# Patient Record
Sex: Male | Born: 1990 | Race: White | Hispanic: No | Marital: Single | Smoking: Current every day smoker
Health system: Southern US, Community
[De-identification: ages and names within clinical notes are randomized; demographics above are authoritative.]

## PROBLEM LIST (undated history)

## (undated) DIAGNOSIS — F1910 Other psychoactive substance abuse, uncomplicated: Secondary | ICD-10-CM

---
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
## 2013-02-03 ENCOUNTER — Encounter (HOSPITAL_COMMUNITY): Payer: Self-pay | Admitting: *Deleted

## 2013-02-03 ENCOUNTER — Emergency Department (HOSPITAL_COMMUNITY)
Admission: EM | Admit: 2013-02-03 | Discharge: 2013-02-04 | Disposition: A | Discharge status: Home / Self Care | Payer: Self-pay | Attending: Emergency Medicine | Admitting: Emergency Medicine

## 2013-02-03 DIAGNOSIS — R45851 Suicidal ideations: Secondary | ICD-10-CM

## 2013-02-03 DIAGNOSIS — F17200 Nicotine dependence, unspecified, uncomplicated: Secondary | ICD-10-CM | POA: Insufficient documentation

## 2013-02-03 DIAGNOSIS — F1910 Other psychoactive substance abuse, uncomplicated: Secondary | ICD-10-CM | POA: Diagnosis present

## 2013-02-03 HISTORY — DX: Other psychoactive substance abuse, uncomplicated: F19.10

## 2013-02-03 LAB — COMPREHENSIVE METABOLIC PANEL
ALT: 22 U/L (ref 0–53)
AST: 20 U/L (ref 0–37)
Albumin: 4.4 g/dL (ref 3.5–5.2)
CO2: 26 mEq/L (ref 19–32)
Calcium: 10.2 mg/dL (ref 8.4–10.5)
Chloride: 102 mEq/L (ref 96–112)
Creatinine, Ser: 1.12 mg/dL (ref 0.50–1.35)
GFR calc non Af Amer: >90 mL/min (ref >90)
Sodium: 139 mEq/L (ref 135–145)

## 2013-02-03 LAB — CBC
MCH: 31.5 pg (ref 26.0–34.0)
MCV: 90.8 fL (ref 78.0–100.0)
Platelets: 202 10*3/uL (ref 150–400)
RBC: 4.89 MIL/uL (ref 4.22–5.81)
RDW: 12.6 % (ref 11.5–15.5)
WBC: 8.8 10*3/uL (ref 4.0–10.5)

## 2013-02-03 LAB — RAPID URINE DRUG SCREEN, HOSP PERFORMED
Amphetamines: POSITIVE — AB
Benzodiazepines: NOT DETECTED
Opiates: NOT DETECTED
Tetrahydrocannabinol: POSITIVE — AB

## 2013-02-03 LAB — ACETAMINOPHEN LEVEL: Acetaminophen (Tylenol), Serum: <15 ug/mL (ref 10–30)

## 2013-02-03 LAB — SALICYLATE LEVEL: Salicylate Lvl: <2 mg/dL — ABNORMAL LOW (ref 2.8–20.0)

## 2013-02-03 NOTE — ED Notes (Signed)
Pt states is here to get off "drugs", states does THC, Cocaine, and "pills, pt states he recently overdosed and didn't take rehab seriously, states cousin recently got killed and that is making him want to clean up and get help now. Pt states has thoughts of SI, no plan, denies HI.

## 2013-02-03 NOTE — ED Notes (Signed)
Dalton Dean [phone] call if needed

## 2013-02-03 NOTE — ED Provider Notes (Signed)
History    This chart was scribed for non-physician practitioner Junious Silk, PA working with Derwood Kaplan, MD by Quintella Reichert, ED Scribe. This patient was seen in room WTR5/WTR5 and the patient's care was started at 10:04 PM .  CSN: [id number]  Arrival date & time 02/03/13  2039    Chief Complaint  Patient presents with  . Medical Clearance    The history is provided by the patient. No language interpreter was used.     HPI Comments: Dalton Dean is a 22 y.o. male who presents to the Emergency Department complaining of polysubstance abuse.   Pt reports that he regularly uses cocaine, narcotic pills, and marijuana and he now states that he wants to he "get away from everybody" and get clean.  He reports he uses 2 half-grams of cocaine every day and whatever narcotic pain medications he can find.  He last used cocaine and narcotics 2-3 days ago and last smoked marijuana last night.  Presently pt reports "I feel fine" and denies recent SI, HI, or auditory or visual hallucinations.  He does note that 2 months ago while he was on cocaine he made a "spur-of-the-moment" intentional decision to overdose, but immediately came to the ED afterward.  He notes that he once stopped all substance abuse except for marijuana for 17 days without assistance, but denies seeking rehabilitation treatment in the past.  Pt reports that he drinks alcohol in moderation.     Past Medical History  Diagnosis Date  . Drug abuse    History reviewed. No pertinent past surgical history.  No family history on file.  History  Substance Use Topics  . Smoking status: Current Every Day Smoker -- 1.00 packs/day    Types: Cigarettes  . Smokeless tobacco: Never Used  . Alcohol Use: Yes     Review of Systems  All other systems reviewed and are negative.      Allergies  Pineapple  Home Medications   Current Outpatient Rx  Name  Route  Sig  Dispense  Refill  . oxycodone (ROXICODONE) 30 MG  immediate release tablet   Oral   Take 60 mg by mouth daily.          BP 112/71  Pulse 78  Temp(Src) 98.2 F (36.8 C) (Oral)  Resp 18  Ht 5\' 11"  (1.803 m)  Wt 133 lb (60.328 kg)  BMI 18.56 kg/m2  SpO2 99%  Physical Exam  Nursing note and vitals reviewed. Constitutional: He is oriented to person, place, and time. He appears well-developed and well-nourished. No distress.  HENT:  Head: Normocephalic and atraumatic.  Right Ear: External ear normal.  Left Ear: External ear normal.  Nose: Nose normal.  Eyes: Conjunctivae are normal.  Neck: Normal range of motion. No tracheal deviation present.  Cardiovascular: Normal rate, regular rhythm and normal heart sounds.   Pulmonary/Chest: Effort normal and breath sounds normal. No stridor.  Abdominal: Soft. He exhibits no distension. There is no tenderness.  Musculoskeletal: Normal range of motion.  Neurological: He is alert and oriented to person, place, and time.  Skin: Skin is warm and dry. He is not diaphoretic.  Psychiatric: He has a normal mood and affect. His behavior is normal.    ED Course  Procedures (including critical care time)  DIAGNOSTIC STUDIES: Oxygen Saturation is 99% on room air, normal by my interpretation.    COORDINATION OF CARE: 10:08 PM: Discussed treatment plan which includes evaluation by ACT team.  Pt expressed understanding and  agreed to plan.   Labs Reviewed  COMPREHENSIVE METABOLIC PANEL - Abnormal; Notable for the following:    Glucose, Bld 112 (*)    All other components within normal limits  SALICYLATE LEVEL - Abnormal; Notable for the following:    Salicylate Lvl <2.0 (*)    All other components within normal limits  URINE RAPID DRUG SCREEN (HOSP PERFORMED) - Abnormal; Notable for the following:    Cocaine POSITIVE (*)    Amphetamines POSITIVE (*)    Tetrahydrocannabinol POSITIVE (*)    All other components within normal limits  ACETAMINOPHEN LEVEL  CBC  ETHANOL   No results  found.  1. Substance abuse     MDM  Patient presents for detox from cocaine, pain medication, and marijuana. He reports to the nurse that he had SI with prior attempt. This story is inconsistent with my interview with the patient. The patient cannot be trusted. He is agitated and angry that he cannot go out to smoke a cigarette. He was offered a nicotine patch multiple times. IVC paperwork taken out on patient. Vital signs stable for transfer to psych ED.    I personally performed the services described in this documentation, which was scribed in my presence. The recorded information has been reviewed and is accurate.     Mora Bellman, PA-C 02/04/13 [phone]

## 2013-02-04 ENCOUNTER — Encounter (HOSPITAL_COMMUNITY): Payer: Self-pay | Admitting: Registered Nurse

## 2013-02-04 DIAGNOSIS — F1910 Other psychoactive substance abuse, uncomplicated: Secondary | ICD-10-CM

## 2013-02-04 MED ORDER — LORAZEPAM 2 MG/ML IJ SOLN
2.0000 mg | Freq: Once | INTRAMUSCULAR | Status: AC
  Administered 2013-02-04: 2 mg via INTRAMUSCULAR
  Filled 2013-02-04: qty 1

## 2013-02-04 MED ORDER — NICOTINE 21 MG/24HR TD PT24: 21.0000 mg | MEDICATED_PATCH | Freq: Every day | TRANSDERMAL | Status: DC

## 2013-02-04 MED ORDER — ALUM & MAG HYDROXIDE-SIMETH 200-200-20 MG/5ML PO SUSP: 30.0000 mL | ORAL | Status: DC | PRN

## 2013-02-04 MED ORDER — ZIPRASIDONE MESYLATE 20 MG IM SOLR
20.0000 mg | Freq: Once | INTRAMUSCULAR | Status: AC
  Administered 2013-02-04: 20 mg via INTRAMUSCULAR
  Filled 2013-02-04: qty 20

## 2013-02-04 MED ORDER — ONDANSETRON HCL 4 MG PO TABS: 4.0000 mg | ORAL_TABLET | Freq: Three times a day (TID) | ORAL | Status: DC | PRN

## 2013-02-04 MED ORDER — LORAZEPAM 1 MG PO TABS: 1.0000 mg | ORAL_TABLET | Freq: Three times a day (TID) | ORAL | Status: DC | PRN

## 2013-02-04 MED ORDER — IBUPROFEN 600 MG PO TABS
600.0000 mg | ORAL_TABLET | Freq: Three times a day (TID) | ORAL | Status: DC | PRN
  Filled 2013-02-04: qty 1

## 2013-02-04 MED ORDER — ZOLPIDEM TARTRATE 5 MG PO TABS: 5.0000 mg | ORAL_TABLET | Freq: Every evening | ORAL | Status: DC | PRN

## 2013-02-04 MED ORDER — DIPHENHYDRAMINE HCL 50 MG/ML IJ SOLN
50.0000 mg | Freq: Once | INTRAMUSCULAR | Status: AC
  Administered 2013-02-04: 50 mg via INTRAMUSCULAR
  Filled 2013-02-04: qty 1

## 2013-02-04 MED ORDER — ACETAMINOPHEN 325 MG PO TABS: 650.0000 mg | ORAL_TABLET | ORAL | Status: DC | PRN

## 2013-02-04 NOTE — ED Provider Notes (Signed)
Dalton Dean is a 22 y.o. male who is being evaluated for substance abuse. The initial provided. I saw him felt that he was not suicidal. Later, he was involuntarily committed for suicidal ideation. At this time, he has been seen by ACT,  and a psychiatry, who feels that he is not suicidal. He has access to care, in the outpatient setting.  He is calm, comfortable. He is not suicidal.  His IVC, was rescinded   He will followup at the [facility] health facility  Dalton Melter, MD 02/04/13 2237

## 2013-02-04 NOTE — BHH Counselor (Signed)
Writer provided patient with outpatient referrals for substance abuse and mental health.

## 2013-02-04 NOTE — BH Assessment (Addendum)
Assessment Note     Patient is a 22 year old white male that requests detox from poly substance abuse.  Patient was IVC'd on 02-03-2013 while in the Emergency Room. Patient states that he has a long history of substances abuse (Cocaine, THC and pain pills [Roxy and Percocet]).  Patient reports that he uses 2 half-grams of cocaine every day and whatever narcotic pain medications he can find.   Patient reports that his last use of cocaine and narcotics was 2-3 days ago and last smoked marijuana last night.  Patient reports that he has been using regularly since he was 22 years old.   Patient reports that his longest period of sobriety was for 17 days when he was 22 years old.    Presently patient denies SI/HI.  Patient denies psychosis.  Patient reports that he followed through on a plan to kill himself by overdosing two months ago.  Patient reports that he immediately came to the ED afterward.   Patient reports that he regularly uses cocaine, narcotic pills, and marijuana and he now states that he wants to he "get away from everybody" and get clean.  Patient denies any prior behavioral health treatment.  Patient denies prior psychiatric hospitalization.    Axis I: Substance Abuse Axis II: Deferred Axis III:  Past Medical History  Diagnosis Date  . Drug abuse    Axis IV: economic problems, occupational problems, problems related to social environment and problems with access to health care services Axis V: 31-40 impairment in reality testing  Past Medical History:  Past Medical History  Diagnosis Date  . Drug abuse     History reviewed. No pertinent past surgical history.  Family History: No family history on file.  Social History:  reports that he has been smoking Cigarettes.  He has been smoking about 1.00 pack per day. He has never used smokeless tobacco. He reports that  drinks alcohol. He reports that he uses illicit drugs (Cocaine and Marijuana).  Additional Social History:      CIWA: CIWA-Ar BP: 103/59 mmHg Pulse Rate: 57 COWS:    Allergies:  Allergies  Allergen Reactions  . Pineapple Hives and Rash    Home Medications:  (Not in a hospital admission)  OB/GYN Status:  No LMP for male patient.  General Assessment Data Location of Assessment: [facility] ED ACT Assessment: Yes Living Arrangements: Parent Can pt return to current living arrangement?: Yes Admission Status: Involuntary Is patient capable of signing voluntary admission?: Yes Transfer from: Acute Hospital Referral Source: Self/Family/Friend  Education Status Is patient currently in school?: No  Risk to self Suicidal Ideation: No Suicidal Intent: No Is patient at risk for suicide?: No Suicidal Plan?: No Access to Means: No What has been your use of drugs/alcohol within the last 12 months?: THC, Cocaine and pills  Previous Attempts/Gestures: Yes How many times?: 1 Other Self Harm Risks: None Triggers for Past Attempts: None known Intentional Self Injurious Behavior: None Family Suicide History: No Recent stressful life event(s): Financial Problems;Other (Comment) Persecutory voices/beliefs?: No Depression: Yes Depression Symptoms: Despondent;Tearfulness;Fatigue;Guilt;Loss of interest in usual pleasures;Feeling worthless/self pity Substance abuse history and/or treatment for substance abuse?: Yes Suicide prevention information given to non-admitted patients: Not applicable  Risk to Others Homicidal Ideation: No Thoughts of Harm to Others: No Current Homicidal Intent: No Current Homicidal Plan: No Access to Homicidal Means: No Identified Victim: None History of harm to others?: No Assessment of Violence: None Noted Violent Behavior Description: None  Does patient have access  to weapons?: No Criminal Charges Pending?: No Does patient have a court date: No  Psychosis Hallucinations: None noted Delusions: None noted  Mental Status Report Appear/Hygiene: Disheveled Eye  Contact: Poor Motor Activity: Freedom of movement Speech: Logical/coherent Level of Consciousness: Alert Mood: Despair;Depressed Affect: Depressed Anxiety Level: Moderate Thought Processes: Relevant Judgement: Unimpaired Orientation: Person;Place;Time;Situation Obsessive Compulsive Thoughts/Behaviors: None  Cognitive Functioning Concentration: Decreased Memory: Recent Intact;Remote Intact IQ: Average Insight: Poor Impulse Control: Poor Appetite: Fair Weight Loss: 0 Weight Gain: 0 Sleep: No Change Total Hours of Sleep: 8 Vegetative Symptoms: None  ADLScreening [facility]) Patient's cognitive ability adequate to safely complete daily activities?: Yes Patient able to express need for assistance with ADLs?: Yes Independently performs ADLs?: Yes (appropriate for developmental age)  Abuse/Neglect [facility]) Physical Abuse: Denies Verbal Abuse: Denies Sexual Abuse: Denies  Prior Inpatient Therapy Prior Inpatient Therapy: No Prior Therapy Dates: na Prior Therapy Facilty/Provider(s): na Reason for Treatment: na  Prior Outpatient Therapy Prior Outpatient Therapy: No Prior Therapy Dates: na Prior Therapy Facilty/Provider(s): na Reason for Treatment: na  ADL Screening (condition at time of admission) Patient's cognitive ability adequate to safely complete daily activities?: Yes Patient able to express need for assistance with ADLs?: Yes Independently performs ADLs?: Yes (appropriate for developmental age)       Abuse/Neglect Assessment (Assessment to be complete while patient is alone) Physical Abuse: Denies Verbal Abuse: Denies Sexual Abuse: Denies Values / Beliefs Cultural Requests During Hospitalization: None Spiritual Requests During Hospitalization: None        Additional Information 1:1 In Past 12 Months?: No CIRT Risk: No Elopement Risk: No Does patient have medical clearance?: Yes     Disposition: Pending consult with Psychiatrist.     Disposition Initial Assessment Completed for this Encounter: Yes Disposition of Patient: Other dispositions Other disposition(s): Other (Comment)  On Site Evaluation by:   Reviewed with Physician:     Phillip Heal LaVerne 02/04/2013 2:00 PM

## 2013-02-04 NOTE — ED Notes (Signed)
Patient is yelling out in his room,now pa spencer is speaking with him

## 2013-02-04 NOTE — ED Notes (Signed)
Dr.Linker made aware of vital signs...[id number]..Marland Kitchenrecheck in an hour

## 2013-02-04 NOTE — ED Notes (Signed)
As i was doing my 130 rounding patient was threatening to escape out of here

## 2013-02-04 NOTE — ED Notes (Signed)
Discharge note: pt. Denies SI/HI/AVH, involuntary  cancel. belongings given to pt. PT stated he was motivated  for  outpt tx  and was going to [facility]. Discharge instructions given to pt . No medications ordered.No complaints voiced. ambulatory

## 2013-02-04 NOTE — Consult Note (Signed)
Reason for Consult:Substance abuse Referring Physician: Cynda Acres MD  Dalton Dean is an 22 y.o. male.  HPI:       Patient presented to the ED to request assistance with polysubstance abuse.  NO SI/HI or AVH. Has a history of suicide attempt when he lost his home 2 months ago, but now states he is ready to get clean and wants assistance.  He has used opiates, THC, but his drug of choice is cocaine and THC. He has never had any substance abuse treatment.  Past Medical History  Diagnosis Date  . Drug abuse     History reviewed. No pertinent past surgical history.  No family history on file.  Social History:  reports that he has been smoking Cigarettes.  He has been smoking about 1.00 pack per day. He has never used smokeless tobacco. He reports that  drinks alcohol. He reports that he uses illicit drugs (Cocaine and Marijuana).  Allergies:  Allergies  Allergen Reactions  . Pineapple Hives and Rash    Medications:   Results for orders placed during the hospital encounter of 02/03/13 (from the past 48 hour(s))  URINE RAPID DRUG SCREEN (HOSP PERFORMED)     Status: Abnormal   Collection Time    02/03/13  9:37 PM      Result Value Range   Opiates NONE DETECTED  NONE DETECTED   Cocaine POSITIVE (*) NONE DETECTED   Benzodiazepines NONE DETECTED  NONE DETECTED   Amphetamines POSITIVE (*) NONE DETECTED   Tetrahydrocannabinol POSITIVE (*) NONE DETECTED   Barbiturates NONE DETECTED  NONE DETECTED   Comment:            DRUG SCREEN FOR MEDICAL PURPOSES     ONLY.  IF CONFIRMATION IS NEEDED     FOR ANY PURPOSE, NOTIFY LAB     WITHIN 5 DAYS.                LOWEST DETECTABLE LIMITS     FOR URINE DRUG SCREEN     Drug Class       Cutoff (ng/mL)     Amphetamine      1000     Barbiturate      200     Benzodiazepine   200     Tricyclics       300     Opiates          300     Cocaine          300     THC              50  ACETAMINOPHEN LEVEL     Status: None   Collection Time    02/03/13  10:03 PM      Result Value Range   Acetaminophen (Tylenol), Serum <15.0  10 - 30 ug/mL   Comment:            THERAPEUTIC CONCENTRATIONS VARY     SIGNIFICANTLY. A RANGE OF 10-30     ug/mL MAY BE AN EFFECTIVE     CONCENTRATION FOR MANY PATIENTS.     HOWEVER, SOME ARE BEST TREATED     AT CONCENTRATIONS OUTSIDE THIS     RANGE.     ACETAMINOPHEN CONCENTRATIONS     >150 ug/mL AT 4 HOURS AFTER     INGESTION AND >50 ug/mL AT 12     HOURS AFTER INGESTION ARE     OFTEN ASSOCIATED WITH TOXIC     REACTIONS.  CBC     Status: None   Collection Time    02/03/13 10:03 PM      Result Value Range   WBC 8.8  4.0 - 10.5 K/uL   RBC 4.89  4.22 - 5.81 MIL/uL   Hemoglobin 15.4  13.0 - 17.0 g/dL   HCT 16.1  09.6 - 04.5 %   MCV 90.8  78.0 - 100.0 fL   MCH 31.5  26.0 - 34.0 pg   MCHC 34.7  30.0 - 36.0 g/dL   RDW 40.9  81.1 - 91.4 %   Platelets 202  150 - 400 K/uL  COMPREHENSIVE METABOLIC PANEL     Status: Abnormal   Collection Time    02/03/13 10:03 PM      Result Value Range   Sodium 139  135 - 145 mEq/L   Potassium 4.1  3.5 - 5.1 mEq/L   Chloride 102  96 - 112 mEq/L   CO2 26  19 - 32 mEq/L   Glucose, Bld 112 (*) 70 - 99 mg/dL   BUN 18  6 - 23 mg/dL   Creatinine, Ser 7.82  0.50 - 1.35 mg/dL   Calcium 95.6  8.4 - 21.3 mg/dL   Total Protein 7.5  6.0 - 8.3 g/dL   Albumin 4.4  3.5 - 5.2 g/dL   AST 20  0 - 37 U/L   ALT 22  0 - 53 U/L   Alkaline Phosphatase 83  39 - 117 U/L   Total Bilirubin 0.6  0.3 - 1.2 mg/dL   GFR calc non Af Amer >90  >90 mL/min   GFR calc Af Amer >90  >90 mL/min   Comment:            The eGFR has been calculated     using the CKD EPI equation.     This calculation has not been     validated in all clinical     situations.     eGFR's persistently     <90 mL/min signify     possible Chronic Kidney Disease.  ETHANOL     Status: None   Collection Time    02/03/13 10:03 PM      Result Value Range   Alcohol, Ethyl (B) <11  0 - 11 mg/dL   Comment:            LOWEST  DETECTABLE LIMIT FOR     SERUM ALCOHOL IS 11 mg/dL     FOR MEDICAL PURPOSES ONLY  SALICYLATE LEVEL     Status: Abnormal   Collection Time    02/03/13 10:03 PM      Result Value Range   Salicylate Lvl <2.0 (*) 2.8 - 20.0 mg/dL    No results found.  ROS Blood pressure 103/59, pulse 57, temperature 98.2 F (36.8 C), temperature source Oral, resp. rate 20, height 5\' 11"  (1.803 m), weight 60.328 kg (133 lb), SpO2 99.00%. Physical Exam [name] is a well-developed well-nourished white male 22 year old no apparent distress non-meal he is alert oriented and cooperative. He is oriented x3. His speech is clear goal-directed at normal rate and rhythm. He makes good eye contact his mood is euthymic, his affect is congruent. He reports he is frustrated because he liked to smoke a cigarette. Which is why he was involuntarily committed last night, when he tried to leave the emergency room to go smoke.       He denies suicidal ideation, but says he mentioned that last night because  he had heard that you have to say in order to gain admission. He denies homicidal ideation, and auditory or visual hallucinations.        Thought process is linear and goal-directed, thought content is normal. Insight is fair judgment is fair.  Assessment: Patient is requesting rehabilitation for substance abuse, he will be released, and referrals will be given. IVC papers will be rescinded per the ED [facility].  Rona Ravens. [place] RPAC 4:13 PM 02/04/2013   I personally seen the patient agreed with the findings and involved in the treatment plan.

## 2013-02-04 NOTE — ED Provider Notes (Signed)
7:57 AM pt seen and evaluated in psych ED. He is sleeping comfortably and has no current complaints.  Per notes, IVC paperwork has been taken out on patient.  Awaiting psych consult which has not yet been performed to assist with dispo.    Ethelda Chick, MD 02/04/13 [phone]

## 2013-02-04 NOTE — ED Provider Notes (Signed)
Medical screening examination/treatment/procedure(s) were performed by non-physician practitioner and as supervising physician I was immediately available for consultation/collaboration.  Jaela Yepez, MD 02/04/13 1509 

## 2013-02-04 NOTE — ED Notes (Signed)
Junious Silk contacted about patient's behavior. Patient stating " I want to get my stuff and go".

## 2013-02-04 NOTE — Consult Note (Signed)
Reason for Consult: Evaluation for inpatient treatment Referring Physician:  EDP  Dalton Dean is an 22 y.o. male.  HPI:  Patient states that he has a long history of substances abuse (Cocaine,THC and pain pills [Roxy and Percocet]) which he started during his teenage years.  Patient stating that he wants to get clean.  Patient denies Suicidal ideation, homicidal ideation, psychosis, and paranoia.  Patient states that this is the first time that he has sought help to get clean.  Patient denies any prior behavioral heath treatment.    Past Medical History  Diagnosis Date  . Drug abuse     History reviewed. No pertinent past surgical history.  No family history on file.  Social History:  reports that he has been smoking Cigarettes.  He has been smoking about 1.00 pack per day. He has never used smokeless tobacco. He reports that  drinks alcohol. He reports that he uses illicit drugs (Cocaine and Marijuana).  Allergies:  Allergies  Allergen Reactions  . Pineapple Hives and Rash    Medications: I have reviewed the patient's current medications.  Results for orders placed during the hospital encounter of 02/03/13 (from the past 48 hour(s))  URINE RAPID DRUG SCREEN (HOSP PERFORMED)     Status: Abnormal   Collection Time    02/03/13  9:37 PM      Result Value Range   Opiates NONE DETECTED  NONE DETECTED   Cocaine POSITIVE (*) NONE DETECTED   Benzodiazepines NONE DETECTED  NONE DETECTED   Amphetamines POSITIVE (*) NONE DETECTED   Tetrahydrocannabinol POSITIVE (*) NONE DETECTED   Barbiturates NONE DETECTED  NONE DETECTED   Comment:            DRUG SCREEN FOR MEDICAL PURPOSES     ONLY.  IF CONFIRMATION IS NEEDED     FOR ANY PURPOSE, NOTIFY LAB     WITHIN 5 DAYS.                LOWEST DETECTABLE LIMITS     FOR URINE DRUG SCREEN     Drug Class       Cutoff (ng/mL)     Amphetamine      1000     Barbiturate      200     Benzodiazepine   200     Tricyclics       300     Opiates           300     Cocaine          300     THC              50  ACETAMINOPHEN LEVEL     Status: None   Collection Time    02/03/13 10:03 PM      Result Value Range   Acetaminophen (Tylenol), Serum <15.0  10 - 30 ug/mL   Comment:            THERAPEUTIC CONCENTRATIONS VARY     SIGNIFICANTLY. A RANGE OF 10-30     ug/mL MAY BE AN EFFECTIVE     CONCENTRATION FOR MANY PATIENTS.     HOWEVER, SOME ARE BEST TREATED     AT CONCENTRATIONS OUTSIDE THIS     RANGE.     ACETAMINOPHEN CONCENTRATIONS     >150 ug/mL AT 4 HOURS AFTER     INGESTION AND >50 ug/mL AT 12     HOURS AFTER INGESTION ARE  OFTEN ASSOCIATED WITH TOXIC     REACTIONS.  CBC     Status: None   Collection Time    02/03/13 10:03 PM      Result Value Range   WBC 8.8  4.0 - 10.5 K/uL   RBC 4.89  4.22 - 5.81 MIL/uL   Hemoglobin 15.4  13.0 - 17.0 g/dL   HCT 40.9  81.1 - 91.4 %   MCV 90.8  78.0 - 100.0 fL   MCH 31.5  26.0 - 34.0 pg   MCHC 34.7  30.0 - 36.0 g/dL   RDW 78.2  95.6 - 21.3 %   Platelets 202  150 - 400 K/uL  COMPREHENSIVE METABOLIC PANEL     Status: Abnormal   Collection Time    02/03/13 10:03 PM      Result Value Range   Sodium 139  135 - 145 mEq/L   Potassium 4.1  3.5 - 5.1 mEq/L   Chloride 102  96 - 112 mEq/L   CO2 26  19 - 32 mEq/L   Glucose, Bld 112 (*) 70 - 99 mg/dL   BUN 18  6 - 23 mg/dL   Creatinine, Ser 0.86  0.50 - 1.35 mg/dL   Calcium 57.8  8.4 - 46.9 mg/dL   Total Protein 7.5  6.0 - 8.3 g/dL   Albumin 4.4  3.5 - 5.2 g/dL   AST 20  0 - 37 U/L   ALT 22  0 - 53 U/L   Alkaline Phosphatase 83  39 - 117 U/L   Total Bilirubin 0.6  0.3 - 1.2 mg/dL   GFR calc non Af Amer >90  >90 mL/min   GFR calc Af Amer >90  >90 mL/min   Comment:            The eGFR has been calculated     using the CKD EPI equation.     This calculation has not been     validated in all clinical     situations.     eGFR's persistently     <90 mL/min signify     possible Chronic Kidney Disease.  ETHANOL     Status: None    Collection Time    02/03/13 10:03 PM      Result Value Range   Alcohol, Ethyl (B) <11  0 - 11 mg/dL   Comment:            LOWEST DETECTABLE LIMIT FOR     SERUM ALCOHOL IS 11 mg/dL     FOR MEDICAL PURPOSES ONLY  SALICYLATE LEVEL     Status: Abnormal   Collection Time    02/03/13 10:03 PM      Result Value Range   Salicylate Lvl <2.0 (*) 2.8 - 20.0 mg/dL    No results found.  Review of Systems  Psychiatric/Behavioral: Positive for substance abuse (Cocaine, THC, and pain pills (Roxy and percocet)). Negative for depression (Denies), suicidal ideas (Denies), hallucinations (Denies) and memory loss (Denies). The patient is not nervous/anxious (Denies) and does not have insomnia (Denies).    Blood pressure 100/63, pulse 48, temperature 97.6 F (36.4 C), temperature source Oral, resp. rate 20, height 5\' 11"  (1.803 m), weight 60.328 kg (133 lb), SpO2 96.00%. Physical Exam  Constitutional: He is oriented to person, place, and time. He appears well-developed.  Neck: Normal range of motion.  Respiratory: Effort normal.  Musculoskeletal: Normal range of motion.  Neurological: He is alert and oriented to person, place, and time.  Skin: Skin is warm and dry.  Psychiatric: He has a normal mood and affect. His speech is normal and behavior is normal. Thought content is not paranoid and not delusional. Cognition and memory are normal. He expresses no homicidal and no suicidal ideation.  Patient states that he want to get clean   Face to face interview and consult with Dr. Lolly Mustache  Assessment:  Axis I: Polysubstance Abuse Patient does not meet criteria for inpatient treatment at [facility] with denial of depression, SI/HI/psychosis, or paranoia.    Recommendation/Plan:  Refer patient to [facility] and [facility] for rehab treatment.    Rankin, Shuvon,FNP-BC 02/04/2013, 11:13 AM     I personally seen the patient agreed with the findings and involved in the treatment plan.

## 2016-10-28 ENCOUNTER — Emergency Department (HOSPITAL_COMMUNITY)
Admission: EM | Admit: 2016-10-28 | Discharge: 2016-10-28 | Disposition: A | Discharge status: Home / Self Care | Payer: Self-pay | Attending: Emergency Medicine | Admitting: Emergency Medicine

## 2016-10-28 ENCOUNTER — Emergency Department (HOSPITAL_COMMUNITY): Payer: Self-pay

## 2016-10-28 ENCOUNTER — Encounter (HOSPITAL_COMMUNITY): Payer: Self-pay | Admitting: Emergency Medicine

## 2016-10-28 DIAGNOSIS — Y929 Unspecified place or not applicable: Secondary | ICD-10-CM | POA: Insufficient documentation

## 2016-10-28 DIAGNOSIS — F17210 Nicotine dependence, cigarettes, uncomplicated: Secondary | ICD-10-CM | POA: Insufficient documentation

## 2016-10-28 DIAGNOSIS — S80212A Abrasion, left knee, initial encounter: Secondary | ICD-10-CM | POA: Insufficient documentation

## 2016-10-28 DIAGNOSIS — S9031XA Contusion of right foot, initial encounter: Secondary | ICD-10-CM | POA: Insufficient documentation

## 2016-10-28 DIAGNOSIS — S7002XA Contusion of left hip, initial encounter: Secondary | ICD-10-CM | POA: Insufficient documentation

## 2016-10-28 DIAGNOSIS — Y998 Other external cause status: Secondary | ICD-10-CM | POA: Insufficient documentation

## 2016-10-28 DIAGNOSIS — Y9351 Activity, roller skating (inline) and skateboarding: Secondary | ICD-10-CM | POA: Insufficient documentation

## 2016-10-28 MED ORDER — ACETAMINOPHEN 325 MG PO TABS
650.0000 mg | ORAL_TABLET | Freq: Once | ORAL | Status: AC
  Administered 2016-10-28: 650 mg via ORAL
  Filled 2016-10-28: qty 2

## 2016-10-28 MED ORDER — IBUPROFEN 800 MG PO TABS
800.0000 mg | ORAL_TABLET | Freq: Once | ORAL | Status: AC
  Administered 2016-10-28: 800 mg via ORAL
  Filled 2016-10-28: qty 1

## 2016-10-28 NOTE — ED Provider Notes (Signed)
[facility]-EMERGENCY DEPT Provider Note   CSN: [id number] Arrival date & time: 10/28/16  1749   By signing my name below, I, Dalton Dean, attest that this documentation has been prepared under the direction and in the presence of [employer] ServicesHobson Jari Carollo PA-C. Electronically Signed: Cynda AcresHailei Dean, Scribe. 10/28/16. 6:35 PM.  History   Chief Complaint Chief Complaint  Patient presents with  . Foot Injury    HPI Comments: Dalton Dean is a 26 y.o. male with a history of polysubstance abuse, who presents to the Emergency Department complaining of sudden-onset, constant right heel pain that began earlier today. Patient states he was skate boarding, when he fell and dropped 20 feet, injuring his left heel. Patient reports associated left buttock pain. No modifying factors indicated. Patient is an alcohol user, 2 gallons of liquor weekly. Patient was ambulatory coming into the emergency department, now in a wheel chair. Patient denies any nausea, vomiting, head injury, LOC, headache, numbness, bladder/bowel incontinence.   The history is provided by the patient. No language interpreter was used.    Past Medical History:  Diagnosis Date  . Drug abuse     Patient Active Problem List   Diagnosis Date Noted  . Polysubstance abuse 02/04/2013    History reviewed. No pertinent surgical history.     Home Medications    Prior to Admission medications   Medication Sig Start Date End Date Taking? Authorizing Provider  oxycodone (ROXICODONE) 30 MG immediate release tablet Take 60 mg by mouth daily.    Historical Provider, MD    Family History No family history on file.  Social History Social History  Substance Use Topics  . Smoking status: Current Every Day Smoker    Packs/day: 1.00    Years: 13.00    Types: Cigarettes  . Smokeless tobacco: Never Used  . Alcohol use Yes     Comment: heavy drinker     Allergies   Patient has no active allergies.   Review of Systems Review of Systems    Gastrointestinal: Negative for nausea and vomiting.  Genitourinary: Negative for difficulty urinating.  Musculoskeletal: Positive for arthralgias (left buttock, right heel). Negative for gait problem and joint swelling.  Neurological: Negative for syncope, numbness and headaches.  All other systems reviewed and are negative.    Physical Exam Updated Vital Signs BP 114/76 (BP Location: Left Arm)   Pulse (!) 102   Temp 98.4 F (36.9 C) (Temporal)   Resp 18   Ht 5\' 11"  (1.803 m)   Wt 150 lb (68 kg)   SpO2 97%   BMI 20.92 kg/m   Physical Exam  Constitutional: He is oriented to person, place, and time. He appears well-developed.  HENT:  Head: Normocephalic and atraumatic.  Mouth/Throat: Oropharynx is clear and moist.  Eyes: Conjunctivae and EOM are normal. Pupils are equal, round, and reactive to light.  Neck: Normal range of motion. Neck supple.  Cardiovascular: Normal rate and normal heart sounds.   Pulmonary/Chest: Effort normal.  Abdominal: Soft. Bowel sounds are normal.  Musculoskeletal: Normal range of motion. He exhibits tenderness. He exhibits no edema or deformity.  Full ROM of the right hip, full ROM of the right knee with some soreness. No deformity of the tibial area. DP pulse is 2+. Cap refill is less than 2 seconds. Pain to palpation of the right heel. Achilles tendon is intact.  Full ROM of the left, knee, and ankle. Soreness over the left hip. No deformity   Neurological: He is alert and  oriented to person, place, and time.  Skin: Skin is warm and dry. Capillary refill takes less than 2 seconds.  Abrasion of the lateral left knee.   Psychiatric: He has a normal mood and affect.  Nursing note and vitals reviewed.    ED Treatments / Results  DIAGNOSTIC STUDIES: Oxygen Saturation is 97% on RA, normal by my interpretation.    COORDINATION OF CARE: 6:35 PM Discussed treatment plan with pt at bedside and pt agreed to plan, which includes imaging.   Labs (all  labs ordered are listed, but only abnormal results are displayed) Labs Reviewed - No data to display  EKG  EKG Interpretation None       Radiology No results found.  Procedures Procedures (including critical care time)  Medications Ordered in ED Medications - No data to display   Initial Impression / Assessment and Plan / ED Course  I have reviewed the triage vital signs and the nursing notes.  Pertinent labs & imaging results that were available during my care of the patient were reviewed by me and considered in my medical decision making (see chart for details).       Final Clinical Impressions(s) / ED Diagnoses   MDM: Pt had a 20 foot drop while skate boarding. He has right heel pain and left buttock pain. Will obtain imaging studies.  X-ray of the right foot, right knee, and left pelvis and hip are all negative. The patient continues to have soreness present. Patient will be given crutches to use until he can safely apply weight. Patient will use Tylenol and ibuprofen every 6 hours for discomfort. He will see Dr. Romeo Apple for orthopedic evaluation if not improving. Final diagnoses:  Contusion of right foot, initial encounter  Contusion of left hip, initial encounter    New Prescriptions New Prescriptions   No medications on file   **I personally performed the services described in this documentation, which was scribed in my presence. The recorded information has been reviewed and is accurate.    Ivery Quale, PA-C 10/28/16 1947    Bethann Berkshire, MD 10/28/16 [phone]

## 2016-10-28 NOTE — ED Triage Notes (Signed)
Patient fell right heel, right knee, and left buttock. Per patient fell while on skating on ramp. Denies hitting head or LOC. Denies any protective gear.

## 2016-10-28 NOTE — Discharge Instructions (Signed)
Your vital signs have been reviewed. The x-ray of your foot, hip, and knee are all negative for fracture or dislocation. Please use the crutches until you're able to safely apply weight to your right foot. Please use 600 mg of ibuprofen with breakfast, lunch, dinner, and at bedtime. May use extra strength Tylenol in between the ibuprofen doses if needed. Please apply ice to your areas of contusion. Call Dr. Romeo AppleHarrison for orthopedic evaluation if not improving.

## 2017-08-04 IMAGING — DX DG HIP (WITH OR WITHOUT PELVIS) 2-3V*L*
3 series · 3 of 3 positions shown · non-contrast
Comparison: None.

CLINICAL DATA: Pain after trauma

EXAM:
DG HIP (WITH OR WITHOUT PELVIS) 2-3V LEFT

[pelvis ap]
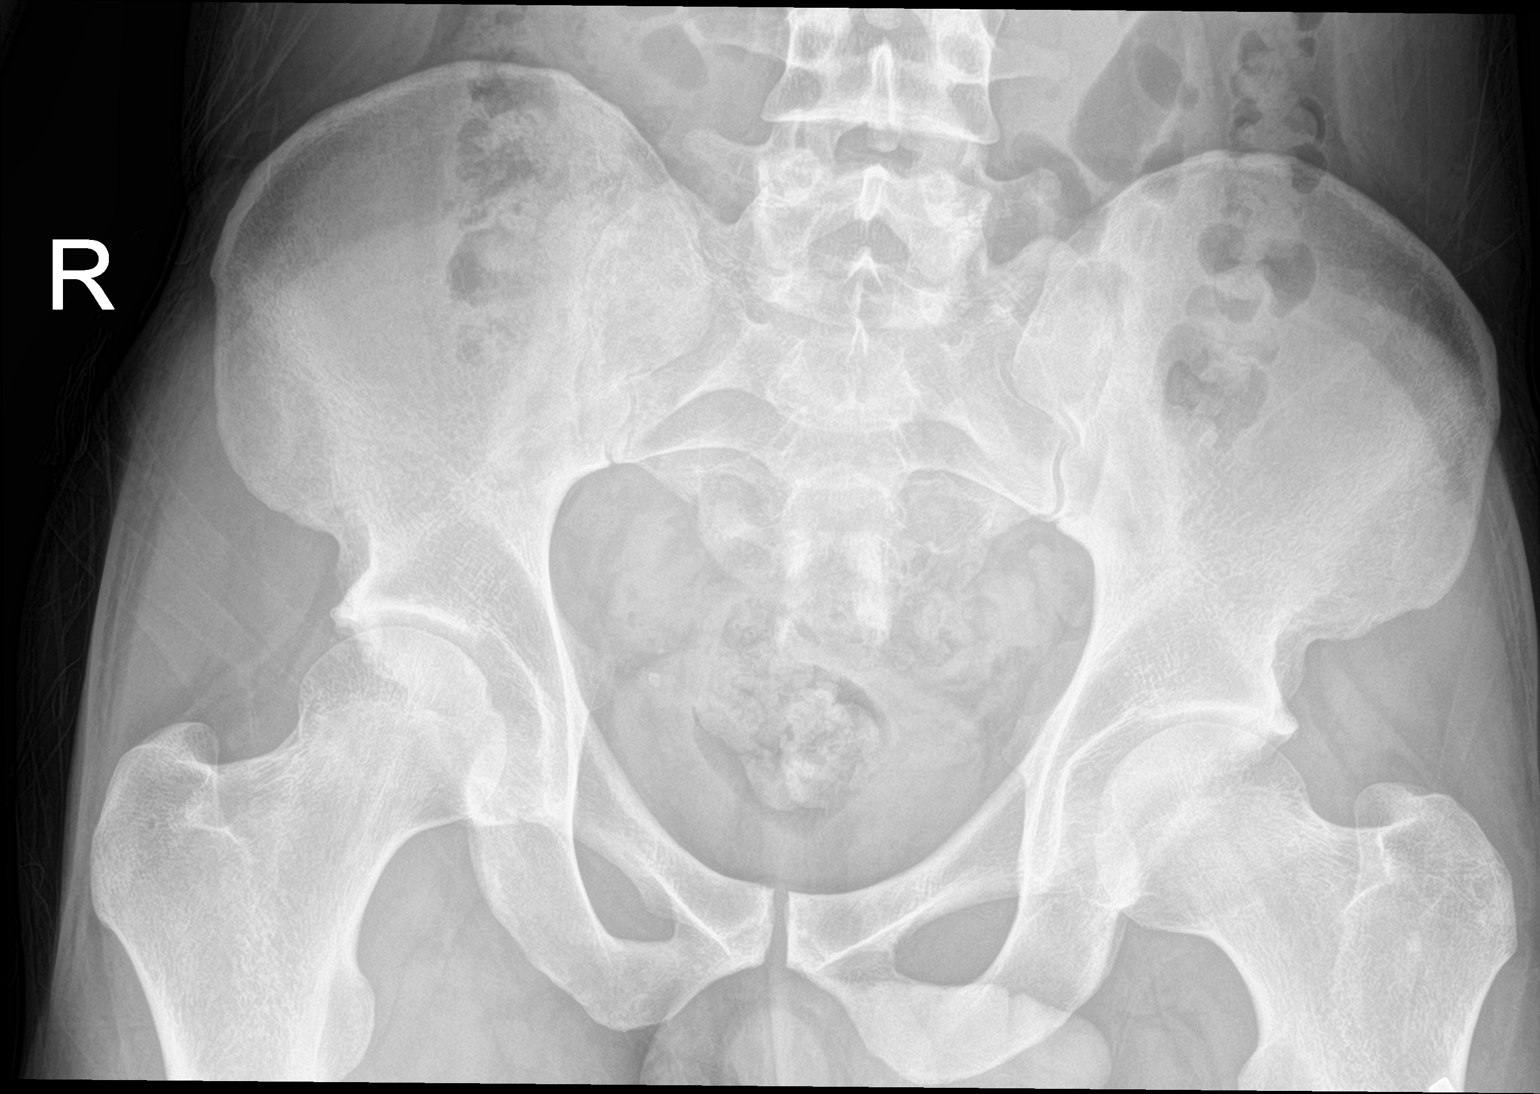

[hip ap]
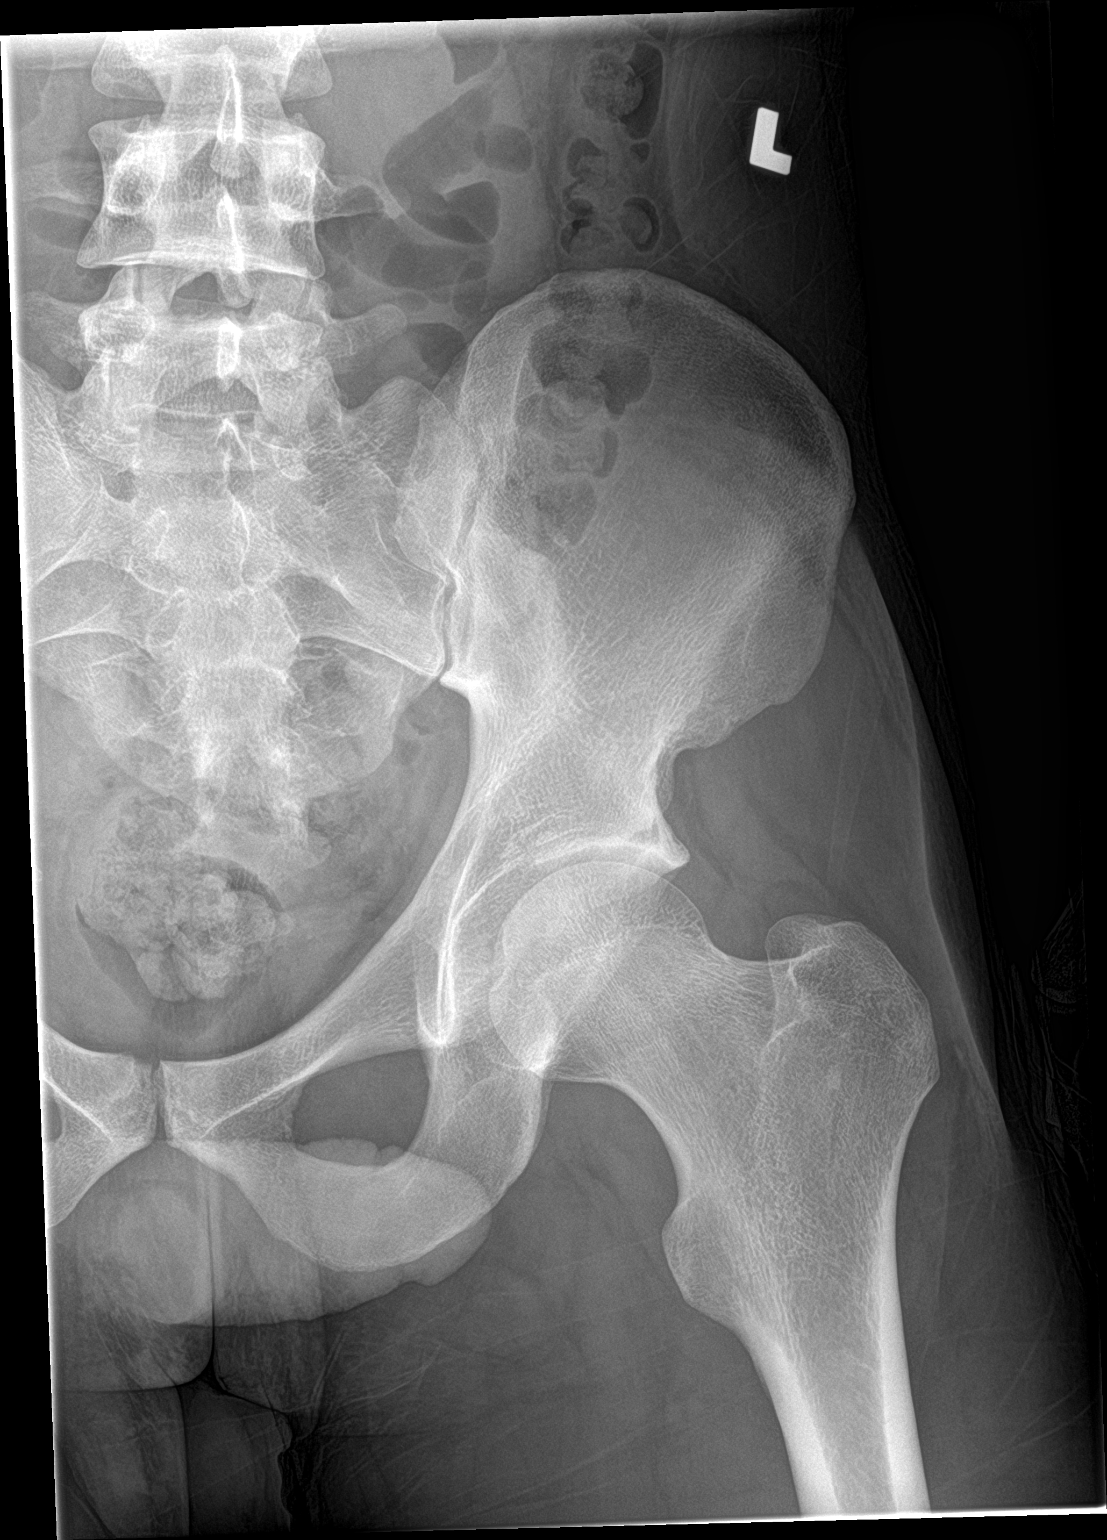

[hip lat]
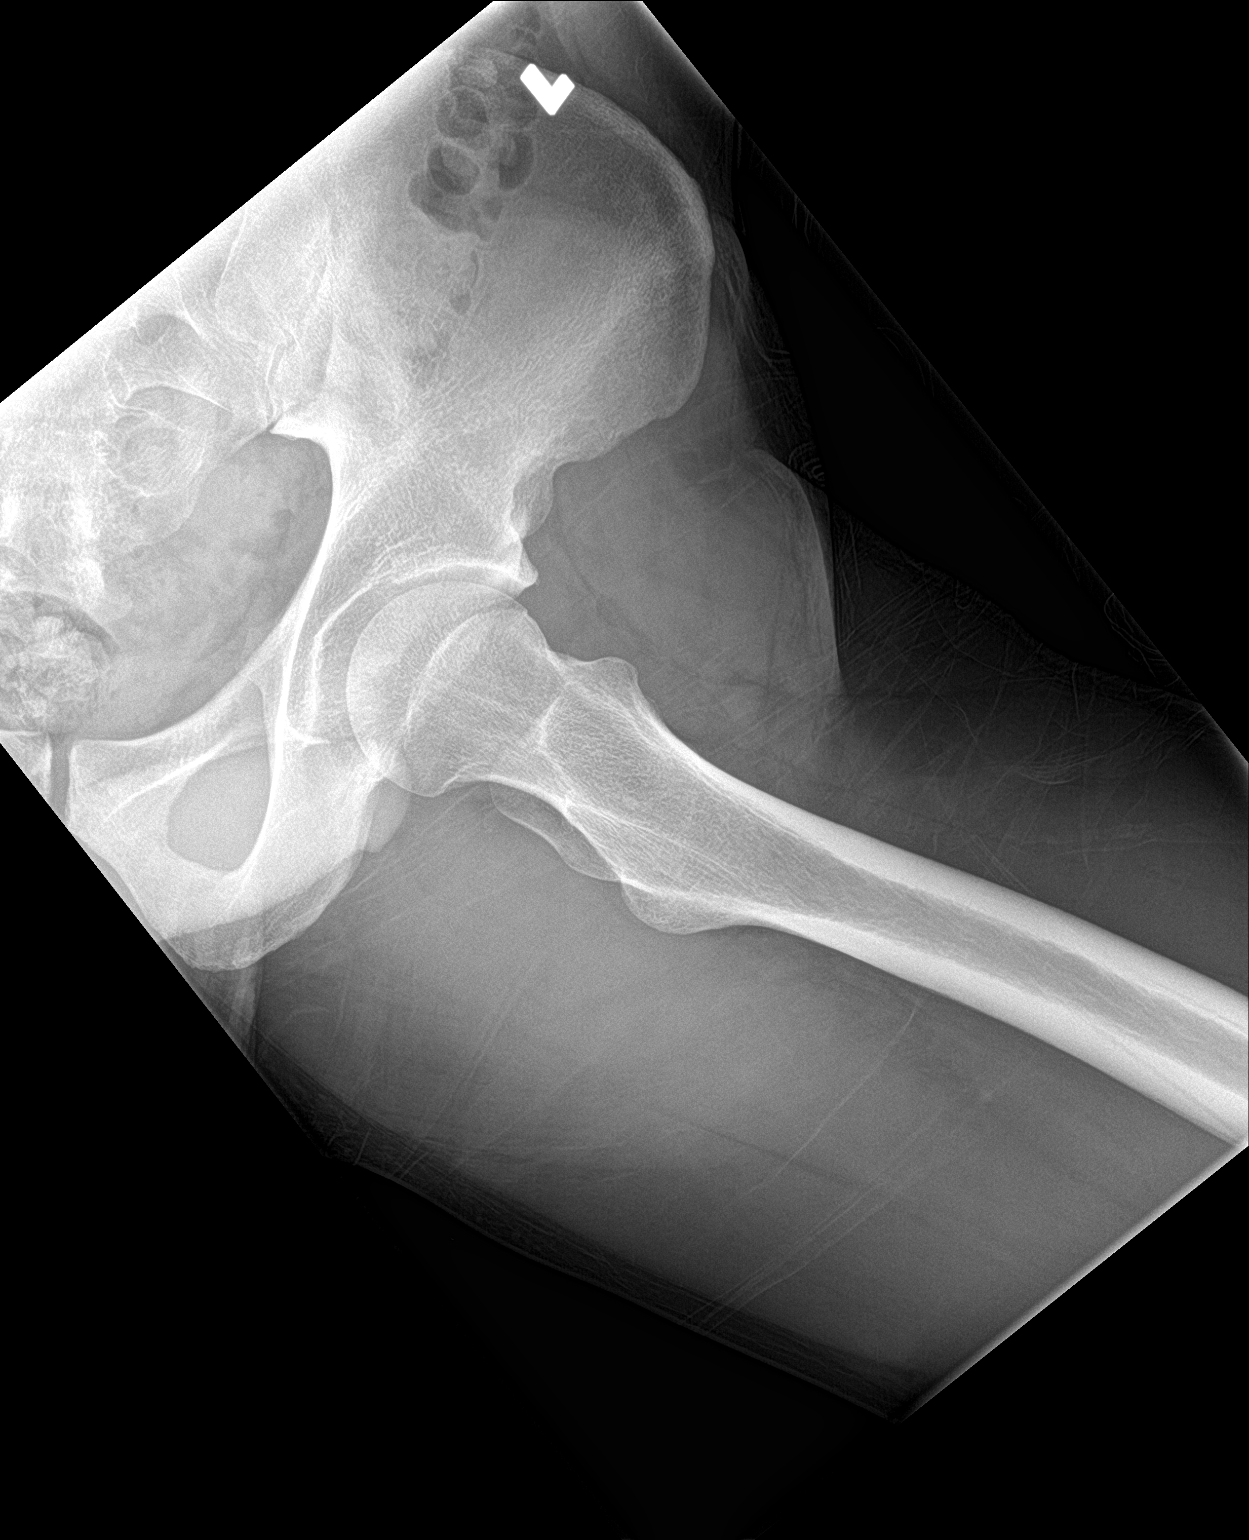

[3 of 3 positions shown; findings below may reference images not displayed]

FINDINGS: There is no evidence of hip fracture or dislocation. There is no
evidence of arthropathy or other focal bone abnormality.
IMPRESSION: Negative.

## 2017-08-04 IMAGING — DX DG FOOT COMPLETE 3+V*R*
3 series · 3 of 3 positions shown · non-contrast
Comparison: None.

CLINICAL DATA: Pain after trauma

EXAM:
RIGHT FOOT COMPLETE - 3+ VIEW

[foot ap]
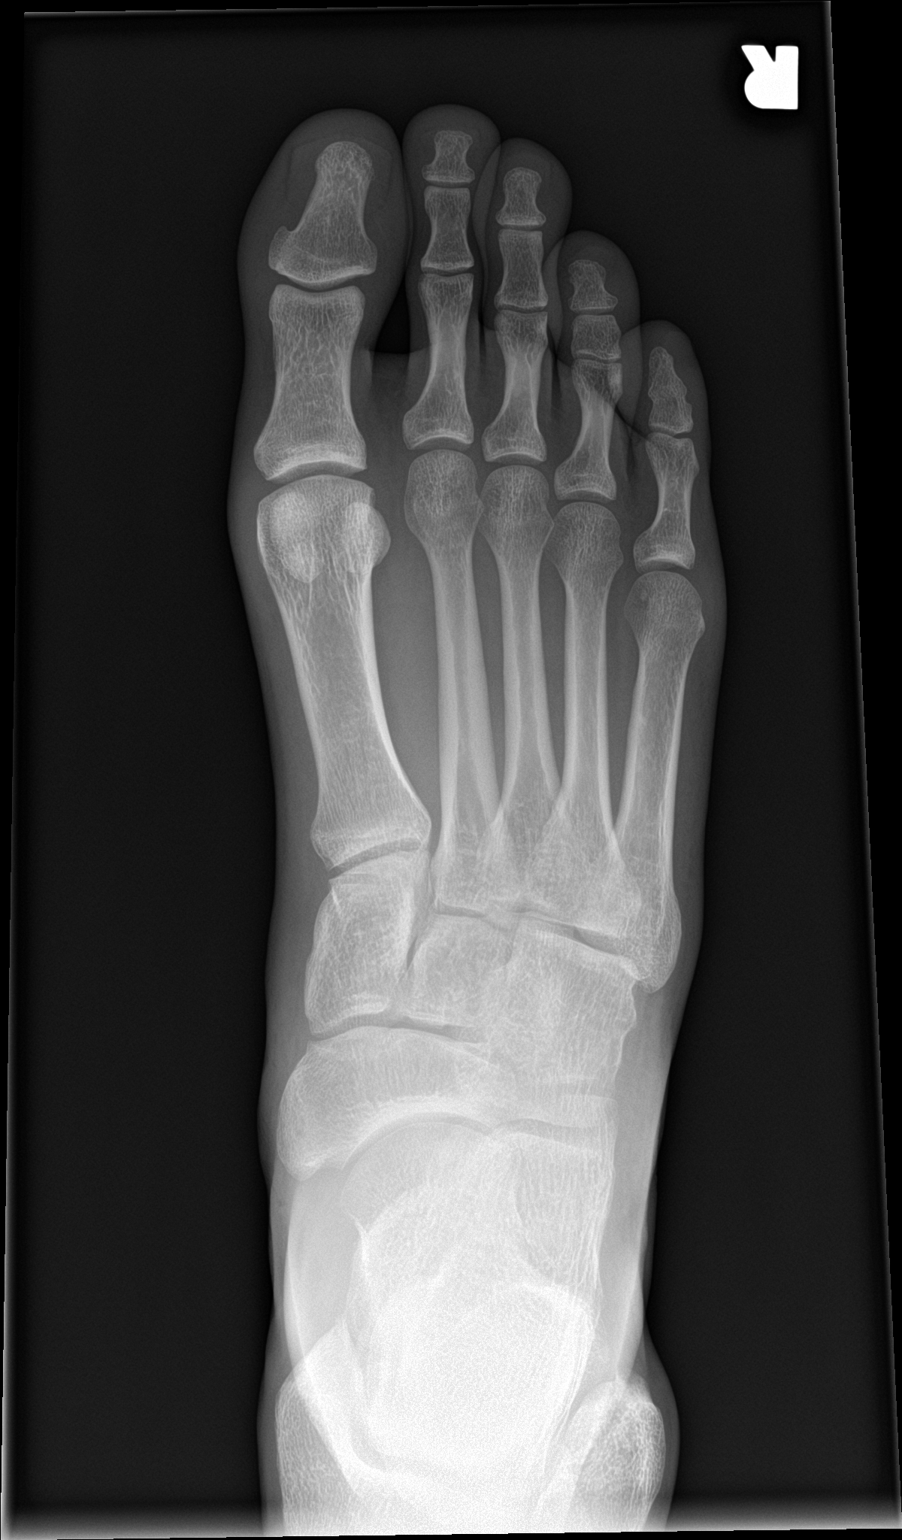

[foot obl]
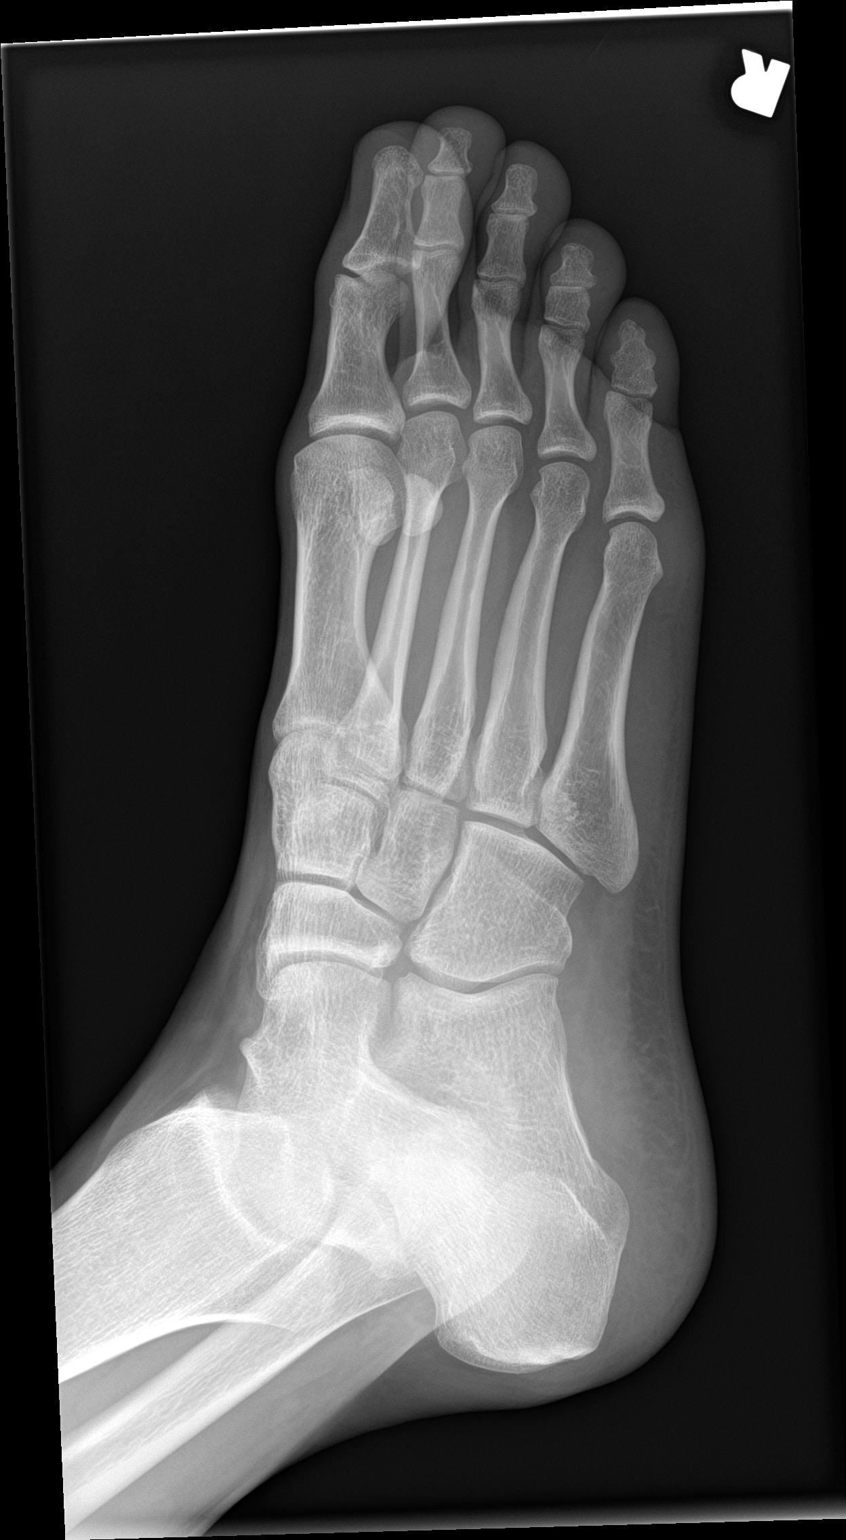

[foot lat]
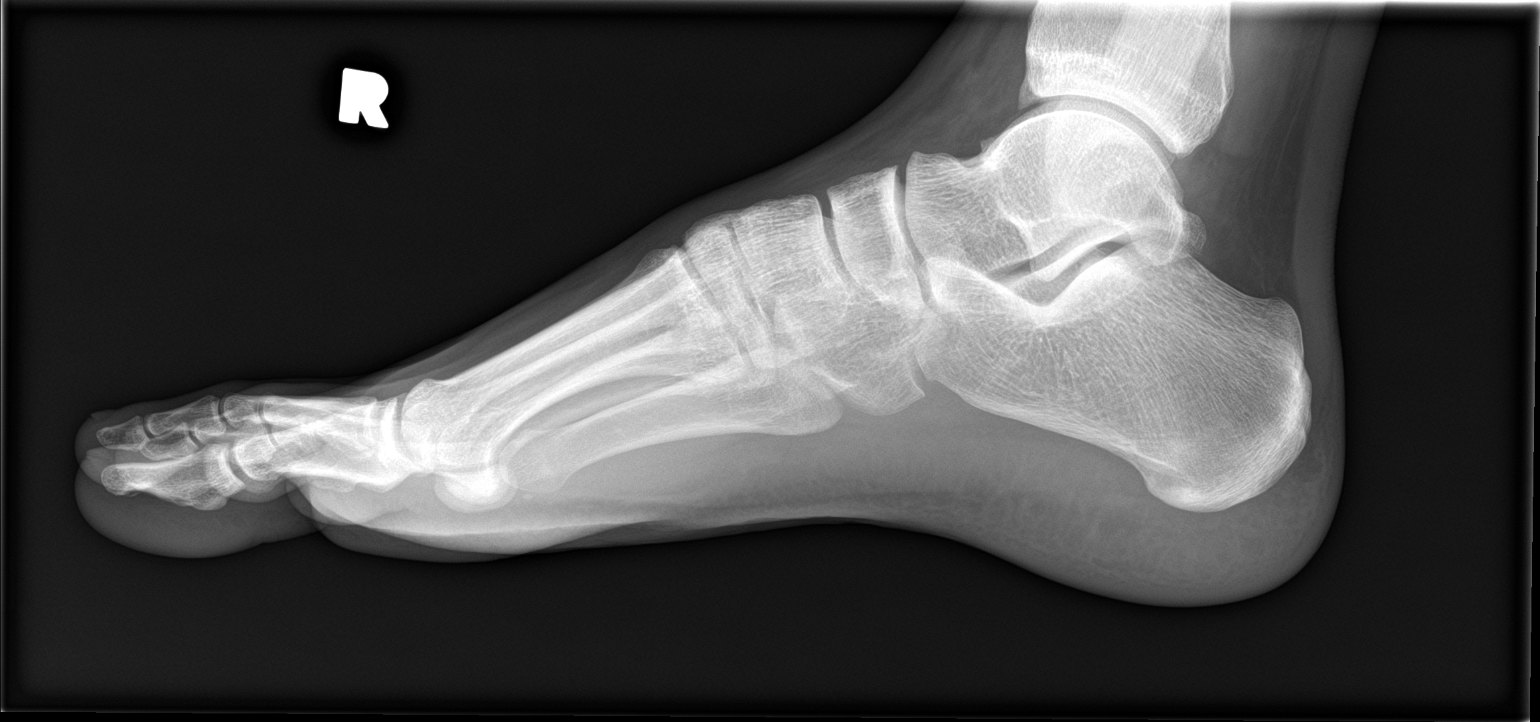

[3 of 3 positions shown; findings below may reference images not displayed]

FINDINGS: There is no evidence of fracture or dislocation. There is no
evidence of arthropathy or other focal bone abnormality. Soft
tissues are unremarkable.
IMPRESSION: Negative.

## 2021-03-28 ENCOUNTER — Emergency Department (HOSPITAL_COMMUNITY)
Admission: EM | Admit: 2021-03-28 | Discharge: 2021-03-28 | Disposition: A | Discharge status: Home / Self Care | Payer: Self-pay

## 2021-03-28 ENCOUNTER — Other Ambulatory Visit: Payer: Self-pay
